# Patient Record
Sex: Male | Born: 1940 | Race: White | Hispanic: No | Smoking: Never smoker
Health system: Southern US, Community
[De-identification: ages and names within clinical notes are randomized; demographics above are authoritative.]

## PROBLEM LIST (undated history)

## (undated) DIAGNOSIS — E785 Hyperlipidemia, unspecified: Secondary | ICD-10-CM

## (undated) DIAGNOSIS — M21611 Bunion of right foot: Secondary | ICD-10-CM

## (undated) DIAGNOSIS — F329 Major depressive disorder, single episode, unspecified: Secondary | ICD-10-CM

## (undated) DIAGNOSIS — F32A Depression, unspecified: Secondary | ICD-10-CM

## (undated) DIAGNOSIS — I4891 Unspecified atrial fibrillation: Secondary | ICD-10-CM

## (undated) DIAGNOSIS — M1990 Unspecified osteoarthritis, unspecified site: Secondary | ICD-10-CM

## (undated) DIAGNOSIS — I10 Essential (primary) hypertension: Secondary | ICD-10-CM

## (undated) HISTORY — DX: Major depressive disorder, single episode, unspecified: F32.9

## (undated) HISTORY — DX: Unspecified atrial fibrillation: I48.91

## (undated) HISTORY — DX: Bunion of right foot: M21.611

## (undated) HISTORY — DX: Depression, unspecified: F32.A

## (undated) HISTORY — DX: Unspecified osteoarthritis, unspecified site: M19.90

## (undated) HISTORY — DX: Hyperlipidemia, unspecified: E78.5

---

## 2005-08-12 ENCOUNTER — Ambulatory Visit: Payer: Self-pay | Admitting: Gastroenterology

## 2009-03-03 ENCOUNTER — Ambulatory Visit: Payer: Self-pay | Admitting: Unknown Physician Specialty

## 2009-05-13 ENCOUNTER — Encounter: Admission: RE | Admit: 2009-05-13 | Discharge: 2009-05-13 | Payer: Self-pay

## 2010-06-29 ENCOUNTER — Ambulatory Visit: Payer: Self-pay | Admitting: Gastroenterology

## 2010-07-01 LAB — PATHOLOGY REPORT

## 2013-06-04 ENCOUNTER — Ambulatory Visit: Payer: Self-pay | Admitting: Internal Medicine

## 2013-06-04 LAB — CBC CANCER CENTER
Basophil #: 0.1 x10 3/mm (ref 0.0–0.1)
Basophil %: 1.7 %
Eosinophil #: 0.4 x10 3/mm (ref 0.0–0.7)
HCT: 41.8 % (ref 40.0–52.0)
HGB: 14.3 g/dL (ref 13.0–18.0)
Lymphocyte %: 26.9 %
MCH: 31.3 pg (ref 26.0–34.0)
MCHC: 34.3 g/dL (ref 32.0–36.0)
Monocyte #: 0.6 x10 3/mm (ref 0.2–1.0)
Monocyte %: 10.5 %
Neutrophil #: 3.2 x10 3/mm (ref 1.4–6.5)
Platelet: 197 x10 3/mm (ref 150–440)
RBC: 4.57 10*6/uL (ref 4.40–5.90)
WBC: 5.9 x10 3/mm (ref 3.8–10.6)

## 2013-06-04 LAB — PLATELET FUNCTION ASSAY
COL/ADP PLT FXN SCRN: 82 Seconds (ref 0–100)
COL/EPI PLT FXN SCRN: >300 Seconds — ABNORMAL HIGH

## 2013-06-04 LAB — PROTIME-INR
INR: 1
Prothrombin Time: 13.6 secs (ref 11.5–14.7)

## 2013-06-04 LAB — COMPREHENSIVE METABOLIC PANEL
Albumin: 3.9 g/dL (ref 3.4–5.0)
Chloride: 109 mmol/L — ABNORMAL HIGH (ref 98–107)
EGFR (Non-African Amer.): >60
Glucose: 108 mg/dL — ABNORMAL HIGH (ref 65–99)
Osmolality: 292 (ref 275–301)

## 2013-06-04 LAB — APTT: Activated PTT: 28.1 secs (ref 23.6–35.9)

## 2013-06-22 ENCOUNTER — Ambulatory Visit: Payer: Self-pay | Admitting: Internal Medicine

## 2013-06-27 ENCOUNTER — Ambulatory Visit: Payer: Self-pay | Admitting: Unknown Physician Specialty

## 2015-12-09 ENCOUNTER — Ambulatory Visit (INDEPENDENT_AMBULATORY_CARE_PROVIDER_SITE_OTHER): Payer: Medicare Other

## 2015-12-09 ENCOUNTER — Encounter: Payer: Self-pay | Admitting: Podiatry

## 2015-12-09 ENCOUNTER — Ambulatory Visit (INDEPENDENT_AMBULATORY_CARE_PROVIDER_SITE_OTHER): Payer: Medicare Other | Admitting: Podiatry

## 2015-12-09 VITALS — BP 131/89 | HR 89 | Resp 18

## 2015-12-09 DIAGNOSIS — M2011 Hallux valgus (acquired), right foot: Secondary | ICD-10-CM

## 2015-12-09 DIAGNOSIS — Q828 Other specified congenital malformations of skin: Secondary | ICD-10-CM

## 2015-12-09 DIAGNOSIS — M722 Plantar fascial fibromatosis: Secondary | ICD-10-CM

## 2015-12-09 NOTE — Progress Notes (Signed)
   Subjective:    Patient ID: Charles SearsGlenn D Corrales, male    DOB: 03/03/1941, 75 y.o.   MRN: [id number]  HPI: He states that he has painful calluses to the plantar aspect of the bilateral foot and has had these for years. It has gotten to the point where he can no longer walk without severe discomfort. He wants to know if there is anything that can be done. He's tried other doctors in the past to no avail.    Review of Systems  Constitutional: Positive for unexpected weight change.  HENT: Positive for sinus pressure.   Eyes: Positive for redness.  Endocrine:       Excessive thirst  Neurological: Positive for dizziness.  All other systems reviewed and are negative.      Objective:   Physical Exam: Vital signs are stable he is alert and oriented 3. Pulses are palpable. Neurologic sensorium is intact per Semmes-Weinstein monofilament. Deep tendon reflexes are intact. Muscle strength +5 over 5 dorsiflexion plantar flexors and inverters everters all digits of musculature is intact. Orthopedic evaluation and stress all joints distal to the ankle hopeful range of motion without crepitation. 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 Hammertoe deformities are noted as well. This is resulting in plantarflexion of the lesser metatarsals and the first metatarsal of the right foot. Cutaneous evaluation of Mr. supple well-hydrated cutis morbid diffuse callus beneath the lesser metatarsal phalangeal joints of the left foot however the right foot does demonstrate a very punctate callus beneath the first metatarsophalangeal joint fibular sesamoid area. No open lesions or wounds.        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  Plan: Sharp debridement of the excessive skin today and chemical debridement of the deep tissue under occlusion with salicylic acid to be left on 3 days and  washed off thoroughly I will follow-up with him in 3 months.

## 2015-12-11 ENCOUNTER — Other Ambulatory Visit: Payer: Self-pay | Admitting: Family Medicine

## 2015-12-11 DIAGNOSIS — R634 Abnormal weight loss: Secondary | ICD-10-CM

## 2015-12-15 ENCOUNTER — Ambulatory Visit
Admission: RE | Admit: 2015-12-15 | Discharge: 2015-12-15 | Disposition: A | Discharge status: Home / Self Care | Payer: Medicare Other | Source: Ambulatory Visit | Attending: Family Medicine | Admitting: Family Medicine

## 2015-12-15 ENCOUNTER — Other Ambulatory Visit: Payer: Self-pay | Admitting: Family Medicine

## 2015-12-15 DIAGNOSIS — R634 Abnormal weight loss: Secondary | ICD-10-CM | POA: Diagnosis present

## 2015-12-15 DIAGNOSIS — I700 Atherosclerosis of aorta: Secondary | ICD-10-CM | POA: Insufficient documentation

## 2015-12-15 DIAGNOSIS — I2510 Atherosclerotic heart disease of native coronary artery without angina pectoris: Secondary | ICD-10-CM | POA: Diagnosis not present

## 2015-12-15 DIAGNOSIS — K5730 Diverticulosis of large intestine without perforation or abscess without bleeding: Secondary | ICD-10-CM | POA: Diagnosis not present

## 2015-12-15 HISTORY — DX: Essential (primary) hypertension: I10

## 2015-12-15 MED ORDER — IOPAMIDOL (ISOVUE-300) INJECTION 61%
100.0000 mL | Freq: Once | INTRAVENOUS | Status: AC | PRN
  Administered 2015-12-15: 100 mL via INTRAVENOUS

## 2016-03-09 ENCOUNTER — Ambulatory Visit: Payer: Medicare Other | Admitting: Podiatry

## 2016-12-27 ENCOUNTER — Encounter: Payer: Medicare Other | Attending: Internal Medicine | Admitting: Dietician

## 2016-12-27 ENCOUNTER — Encounter: Payer: Self-pay | Admitting: Dietician

## 2016-12-27 VITALS — BP 108/64 | Ht 72.0 in | Wt 195.5 lb

## 2016-12-27 DIAGNOSIS — E118 Type 2 diabetes mellitus with unspecified complications: Secondary | ICD-10-CM | POA: Diagnosis present

## 2016-12-27 DIAGNOSIS — Z6826 Body mass index (BMI) 26.0-26.9, adult: Secondary | ICD-10-CM | POA: Insufficient documentation

## 2016-12-27 DIAGNOSIS — Z713 Dietary counseling and surveillance: Secondary | ICD-10-CM | POA: Diagnosis not present

## 2016-12-27 DIAGNOSIS — E119 Type 2 diabetes mellitus without complications: Secondary | ICD-10-CM

## 2016-12-27 NOTE — Progress Notes (Signed)
Diabetes Self-Management Education  Visit Type: First/Initial  Appt. Start Time: 1030 Appt. End Time: 1130  12/27/2016  [name], identified by name and date of birth, is a 76 y.o. male with a diagnosis of Diabetes: Type 2.   ASSESSMENT  Blood pressure 108/64, height 6' (1.829 m), weight 195 lb 8 oz (88.7 kg). Body mass index is 26.51 kg/m.      Diabetes Self-Management Education - 12/27/16 1214      Visit Information   Visit Type First/Initial     Initial Visit   Diabetes Type Type 2     Health Coping   How would you rate your overall health? Good     Psychosocial Assessment   Patient Belief/Attitude about Diabetes Motivated to manage diabetes   Self-care barriers None   Self-management support Doctor's office;Family   Other persons present Patient   Patient Concerns Glycemic Control;Weight Control  prevent complications   Special Needs None   Preferred Learning Style Auditory;Hands on;Visual   Learning Readiness Ready   What is the last grade level you completed in school? 12 + some college     Pre-Education Assessment   Patient understands the diabetes disease and treatment process. Needs Review   Patient understands incorporating nutritional management into lifestyle. Needs Review   Patient undertands incorporating physical activity into lifestyle. Needs Review   Patient understands using medications safely. Needs Review   Patient understands monitoring blood glucose, interpreting and using results Needs Review   Patient understands prevention, detection, and treatment of acute complications. Needs Review   Patient understands prevention, detection, and treatment of chronic complications. Needs Review   Patient understands how to develop strategies to address psychosocial issues. Needs Instruction   Patient understands how to develop strategies to promote health/change behavior. Needs Review     Complications   Last HgB A1C per patient/outside source 5.6  %  12-16-16   How often do you check your blood sugar? 1-2 times/day   Fasting Blood glucose range (mg/dL) 40-981;[id number];130-179   Have you had a dilated eye exam in the past 12 months? No  2 years ago   Have you had a dental exam in the past 12 months? Yes  [id number]; appt 07-2017   Are you checking your feet? Yes-c/o constant pain right foot due to bunion   How many days per week are you checking your feet? 7     Dietary Intake   Breakfast --  eats breakfast at 9a=usually eats oatmeal and fruit   Snack (morning) --  none   Lunch --  eats lunch 12p-1p   Snack (afternoon) --  eats fruit and almonds at 3p   Dinner --  eats supper at 6p   Snack (evening) --  eats snack 8p-9p=almonds, animal crackers, celery, fruit, or carrots    Beverage(s) water 2-3 glasses/day, unsweetened coffee 3 cups/day, orange juice 1x/day and milk 1x/day     Exercise   Exercise Type Light (walking / raking leaves)   How many days per week to you exercise? 6   How many minutes per day do you exercise? 45   Total minutes per week of exercise 270     Patient Education   Previous Diabetes Education No   Disease state  Definition of diabetes, type 1 and 2, and the diagnosis of diabetes;Explored patient's options for treatment of their diabetes   Nutrition management  Role of diet in the treatment of diabetes and the relationship between the three main macronutrients  and blood glucose level;Food label reading, portion sizes and measuring food.;Carbohydrate counting   Physical activity and exercise  Helped patient identify appropriate exercises in relation to his/her diabetes, diabetes complications and other health issue.;Role of exercise on diabetes management, blood pressure control and cardiac health.   Medications Reviewed patients medication for diabetes, action, purpose, timing of dose and side effects.   Monitoring Purpose and frequency of SMBG.;Identified appropriate SMBG and/or A1C goals.;Yearly dilated eye  exam;Taught/discussed recording of test results and interpretation of SMBG.   Chronic complications Relationship between chronic complications and blood glucose control;Dental care;Retinopathy and reason for yearly dilated eye exams;Nephropathy, what it is, prevention of, the use of ACE, ARB's and early detection of through urine microalbumia.;Lipid levels, blood glucose control and heart disease;Assessed and discussed foot care and prevention of foot problems;Reviewed with patient heart disease, higher risk of, and prevention   Personal strategies to promote health Lifestyle issues that need to be addressed for better diabetes care;Helped patient develop diabetes management plan for (enter comment)     Outcomes   Expected Outcomes Demonstrated interest in learning. Expect positive outcomes      Individualized Plan for Diabetes Self-Management Training:   Learning Objective:  Patient will have a greater understanding of diabetes self-management. Patient education plan is to attend individual and/or group sessions per assessed needs and concerns.   Plan:   Patient Instructions   Check blood sugars 2 x day before breakfast OR 2 hrs after supper every  day and record   Bring blood sugar records to the next appointment/class  Exercise: walk  for    30-45  minutes   6 days a week  Eat 3 meals day,   1  snack a day at bedtime  Space meals 4-5 hours apart  Avoid sugar sweetened drinks (soda, tea, coffee, sports drinks, juices)  Limit intake of sweets/desserts and fried foods   Eat 3 carbohydrate servings/meal + protein  Eat 1 carbohydrate serving/snack + protein  Make dentist / eye doctor appointments  Get a Sharps container  Return for appointment/classes on: call if decides to schedule classes   Expected Outcomes:  Demonstrated interest in learning. Expect positive outcomes  Education material provided: General meal planning guidelines  If problems or questions, patient to  contact team via:  [phone]  Future DSME appointment:  TBD

## 2016-12-27 NOTE — Patient Instructions (Signed)
  Check blood sugars 2 x day before breakfast OR 2 hrs after supper every  day and record   Bring blood sugar records to the next appointment/class  Exercise: walk  for    30-45  minutes   6 days a week  Eat 3 meals day,   1  snack a day at bedtime  Space meals 4-5 hours apart  Avoid sugar sweetened drinks (soda, tea, coffee, sports drinks, juices)  Limit intake of sweets/desserts and fried foods   Eat 3 carbohydrate servings/meal + protein  Eat 1 carbohydrate serving/snack + protein  Make dentist / eye doctor appointments  Get a Sharps container  Return for appointment/classes on: call if decides to schedule classes

## 2017-01-03 ENCOUNTER — Telehealth: Payer: Self-pay | Admitting: Dietician

## 2017-01-03 NOTE — Telephone Encounter (Signed)
Have not heard from pt-called pt and gave him dates of upcoming classes but he wants to wait to schedule classes until after he checks on his other dr appointments this month including a stress test for a heart murmur-pt will call us back if he decides to schedule diabetes classes

## 2017-01-09 ENCOUNTER — Encounter: Payer: Self-pay | Admitting: Dietician

## 2018-01-26 ENCOUNTER — Ambulatory Visit (INDEPENDENT_AMBULATORY_CARE_PROVIDER_SITE_OTHER): Payer: Medicare Other | Admitting: Urology

## 2018-01-26 ENCOUNTER — Encounter: Payer: Self-pay | Admitting: Urology

## 2018-01-26 VITALS — BP 128/80 | HR 97 | Ht 66.0 in | Wt 194.0 lb

## 2018-01-26 DIAGNOSIS — R972 Elevated prostate specific antigen [PSA]: Secondary | ICD-10-CM | POA: Diagnosis not present

## 2018-01-26 LAB — URINALYSIS, COMPLETE
Bilirubin, UA: NEGATIVE
Glucose, UA: NEGATIVE
Ketones, UA: NEGATIVE
LEUKOCYTES UA: NEGATIVE
Nitrite, UA: NEGATIVE
PH UA: 6.5 (ref 5.0–7.5)
PROTEIN UA: NEGATIVE
Specific Gravity, UA: 1.015 (ref 1.005–1.030)
Urobilinogen, Ur: 0.2 mg/dL (ref 0.2–1.0)

## 2018-01-26 LAB — MICROSCOPIC EXAMINATION
Bacteria, UA: NONE SEEN
Epithelial Cells (non renal): NONE SEEN /hpf (ref 0–10)

## 2018-01-30 ENCOUNTER — Encounter: Payer: Self-pay | Admitting: Urology

## 2018-01-30 NOTE — Progress Notes (Signed)
01/26/2018 7:15 PM   Charles Mcclure November 09, 1940 [id number]  Referring provider: Marguarite Arbour, MD [address] [facility] [place], [place] [address]  Chief Complaint  Patient presents with  . Elevated PSA    HPI: Charles Mcclure is a 77 year old male referred for evaluation of an elevated PSA.  A PSA performed in April 2019 was 5.58.  Previous PSA results are as follows:  05/2014    2.35 05/2015    2.9 11/2016      3.52 11/2017      5.58  He denies bothersome lower urinary tract symptoms.  He denies dysuria or gross hematuria.  He has no flank, abdominal, pelvic or scrotal pain.   PMH: Past Medical History:  Diagnosis Date  . Arthritis   . Atrial fibrillation (HCC)   . Bunion, right foot   . Depression   . Hyperlipidemia   . Hypertension     Surgical History: History reviewed. No pertinent surgical history.  Home Medications:  Allergies as of 01/26/2018      Reactions   Meperidine Other (See Comments)   restless      Medication List        Accurate as of 01/26/18 11:59 PM. Always use your most recent med list.          apixaban 5 MG Tabs tablet Commonly known as:  ELIQUIS Take 1 tablet by mouth 2 (two) times daily.   atorvastatin 10 MG tablet Commonly known as:  LIPITOR Take 1 tablet by mouth daily.   CALCIUM PO Take 1 tablet by mouth daily.   fenofibrate 160 MG tablet Take 1 tablet by mouth daily.   lisinopril-hydrochlorothiazide 10-12.5 MG tablet Commonly known as:  PRINZIDE,ZESTORETIC Take 1 tablet by mouth daily.   MAGNESIUM PO Take 1 tablet by mouth daily.   metFORMIN 500 MG tablet Commonly known as:  GLUCOPHAGE Take 1 tablet by mouth daily.   metFORMIN 500 MG tablet Commonly known as:  GLUCOPHAGE TAKE 1 TABLET BY MOUTH  DAILY WITH BREAKFAST   niacin 500 MG CR tablet Commonly known as:  NIASPAN Take 1 tablet by mouth daily. Pt taking 1/2 tablet BID   sertraline 50 MG tablet Commonly known as:  ZOLOFT Take 1  tablet by mouth daily.   sildenafil 20 MG tablet Commonly known as:  REVATIO Take 1 tablet by mouth daily as needed.   TGT PSYLLIUM FIBER 0.52 g capsule Generic drug:  psyllium Take 1 capsule by mouth daily.       Allergies:  Allergies  Allergen Reactions  . Meperidine Other (See Comments)    restless    Family History: No family history on file.  Social History:  reports that he has never smoked. He has never used smokeless tobacco. He reports that he drinks about 0.6 - 1.2 oz of alcohol per week. He reports that he does not use drugs.  ROS: UROLOGY Frequent Urination?: Yes Hard to postpone urination?: No Burning/pain with urination?: No Get up at night to urinate?: Yes Leakage of urine?: No Urine stream starts and stops?: No Trouble starting stream?: No Do you have to strain to urinate?: No Blood in urine?: No Urinary tract infection?: No Sexually transmitted disease?: No Injury to kidneys or bladder?: No Painful intercourse?: No Weak stream?: No Penile pain?: No  Gastrointestinal Nausea?: No Vomiting?: No Indigestion/heartburn?: No Diarrhea?: No Constipation?: No  Constitutional Fever: No Night sweats?: No Weight loss?: No Fatigue?: No  Skin Itching?: No  Eyes  Blurred vision?: No Double vision?: Yes  Ears/Nose/Throat Sore throat?: No Sinus problems?: No  Hematologic/Lymphatic Swollen glands?: No Easy bruising?: Yes  Cardiovascular Leg swelling?: No Chest pain?: No  Respiratory Cough?: Yes Shortness of breath?: No  Endocrine Excessive thirst?: No  Musculoskeletal Back pain?: No Joint pain?: No  Neurological Headaches?: No Dizziness?: No  Psychologic Depression?: No Anxiety?: No  Physical Exam: BP 128/80   Pulse 97   Ht 5\' 6"  (1.676 m)   Wt 194 lb (88 kg)   BMI 31.31 kg/m   Constitutional:  Alert and oriented, No acute distress. HEENT: [place] AT, moist mucus membranes.  Trachea midline, no masses. Cardiovascular: No  clubbing, cyanosis, or edema. Respiratory: Normal respiratory effort, no increased work of breathing. GI: Abdomen is soft, nontender, nondistended, no abdominal masses GU: No CVA tenderness.  Prostate 40 g, smooth without nodules Lymph: No cervical or inguinal lymphadenopathy. Skin: No rashes, bruises or suspicious lesions. Neurologic: Grossly intact, no focal deficits, moving all 4 extremities. Psychiatric: Normal mood and affect.  Laboratory Data:  Urinalysis Dipstick/microscopy negative    Assessment & Plan:   Although PSA is a prostate cancer screening test he was informed that cancer is not the most common cause of an elevated PSA. Other potential causes including BPH and inflammation were discussed. He was informed that the only way to adequately diagnose prostate cancer would be a transrectal ultrasound and biopsy of the prostate. The procedure was discussed including potential risks of bleeding and infection/sepsis. He was also informed that a negative biopsy does not conclusively rule out the possibility that prostate cancer may be present and that continued monitoring is required. The use of newer adjunctive blood tests including PHI and 4kScore were discussed. The use of multiparametric prostate MRI was also discussed however is not typically used for initial evaluation of an elevated PSA. Continued periodic surveillance was also discussed.  We did discuss current prostate cancer screening recommendations by the American U[employer] Association and that screening is not recommended after age 77 and up to age 77 in healthy individuals.  Will repeat his PSA however he wanted to hold off on repeating for 6 months.  If his PSA returns to baseline would recommend discontinuing screening PSA.    Riki AltesScott C Breck [place], MD  [facility] [address], [address] [place], [place] [address] [phone]

## 2018-02-04 ENCOUNTER — Encounter: Payer: Self-pay | Admitting: Urology

## 2018-02-04 DIAGNOSIS — R972 Elevated prostate specific antigen [PSA]: Secondary | ICD-10-CM | POA: Insufficient documentation

## 2018-11-21 ENCOUNTER — Other Ambulatory Visit: Payer: Self-pay | Admitting: Internal Medicine

## 2018-11-21 ENCOUNTER — Ambulatory Visit
Admission: RE | Admit: 2018-11-21 | Discharge: 2018-11-21 | Disposition: A | Discharge status: Home / Self Care | Payer: Medicare Other | Source: Ambulatory Visit | Attending: Internal Medicine | Admitting: Internal Medicine

## 2018-11-21 ENCOUNTER — Other Ambulatory Visit: Payer: Self-pay

## 2018-11-21 DIAGNOSIS — R270 Ataxia, unspecified: Secondary | ICD-10-CM

## 2018-11-21 DIAGNOSIS — S0990XA Unspecified injury of head, initial encounter: Secondary | ICD-10-CM | POA: Diagnosis present

## 2018-12-12 ENCOUNTER — Other Ambulatory Visit: Payer: Self-pay | Admitting: Neurology

## 2018-12-12 DIAGNOSIS — I725 Aneurysm of other precerebral arteries: Secondary | ICD-10-CM

## 2019-01-22 ENCOUNTER — Ambulatory Visit: Payer: Medicare Other

## 2019-03-17 ENCOUNTER — Encounter: Payer: Self-pay | Admitting: Medical Oncology

## 2019-03-17 ENCOUNTER — Emergency Department
Admission: EM | Admit: 2019-03-17 | Discharge: 2019-03-17 | Disposition: A | Discharge status: Home / Self Care | Payer: Medicare Other | Attending: Emergency Medicine | Admitting: Emergency Medicine

## 2019-03-17 ENCOUNTER — Emergency Department: Payer: Medicare Other

## 2019-03-17 DIAGNOSIS — F0390 Unspecified dementia without behavioral disturbance: Secondary | ICD-10-CM | POA: Diagnosis not present

## 2019-03-17 DIAGNOSIS — S0001XA Abrasion of scalp, initial encounter: Secondary | ICD-10-CM | POA: Diagnosis not present

## 2019-03-17 DIAGNOSIS — S0990XA Unspecified injury of head, initial encounter: Secondary | ICD-10-CM

## 2019-03-17 DIAGNOSIS — S0081XA Abrasion of other part of head, initial encounter: Secondary | ICD-10-CM | POA: Diagnosis not present

## 2019-03-17 DIAGNOSIS — Y9389 Activity, other specified: Secondary | ICD-10-CM | POA: Insufficient documentation

## 2019-03-17 DIAGNOSIS — I10 Essential (primary) hypertension: Secondary | ICD-10-CM | POA: Insufficient documentation

## 2019-03-17 DIAGNOSIS — W19XXXA Unspecified fall, initial encounter: Secondary | ICD-10-CM

## 2019-03-17 DIAGNOSIS — Y999 Unspecified external cause status: Secondary | ICD-10-CM | POA: Insufficient documentation

## 2019-03-17 DIAGNOSIS — W010XXA Fall on same level from slipping, tripping and stumbling without subsequent striking against object, initial encounter: Secondary | ICD-10-CM | POA: Insufficient documentation

## 2019-03-17 DIAGNOSIS — Y929 Unspecified place or not applicable: Secondary | ICD-10-CM | POA: Diagnosis not present

## 2019-03-17 NOTE — ED Triage Notes (Signed)
Pt from home via ems- states that he was trying to pick something up when he tripped over a rug and fell. Pt denies LOC. A/O x 4. Pt has some bruising to left side of face and eye. Some bruising noted to BILT knees. Pt ambulatory from stretcher to bed.

## 2019-03-17 NOTE — ED Provider Notes (Addendum)
[facility] Emergency Department Provider Note       Time seen: ----------------------------------------- 4:08 PM on 03/17/2019 -----------------------------------------  I have reviewed the triage vital signs and the nursing notes.  HISTORY   Chief Complaint Fall and Head Injury   HPI Charles Mcclure is a 78 y.o. male with a history of arthritis, atrial fibrillation, depression, hyperlipidemia, hypertension who presents to the ED for a mechanical fall.  Patient states he tripped on a rug and family reports he has early dementia.  He denies any complaints, was noted to have some cuts on his face and head.  He is currently takes a blood thinner.  Past Medical History:  Diagnosis Date  . Arthritis   . Atrial fibrillation (HCC)   . Bunion, right foot   . Depression   . Hyperlipidemia   . Hypertension     Patient Active Problem List   Diagnosis Date Noted  . Elevated PSA 02/04/2018    No past surgical history on file.  Allergies Meperidine  Social History Social History   Tobacco Use  . Smoking status: Never Smoker  . Smokeless tobacco: Never Used  Substance Use Topics  . Alcohol use: Yes    Alcohol/week: 1.0 - 2.0 standard drinks    Types: 1 - 2 Cans of beer per week  . Drug use: No   Review of Systems Constitutional: Negative for fever. Cardiovascular: Negative for chest pain. Respiratory: Negative for shortness of breath. Gastrointestinal: Negative for abdominal pain, vomiting and diarrhea. Musculoskeletal: Negative for back pain. Skin: Positive for abrasions and lacerations Neurological: Negative for headaches, focal weakness or numbness.  All systems negative/normal/unremarkable except as stated in the HPI  ____________________________________________   PHYSICAL EXAM:  VITAL SIGNS: ED Triage Vitals  Enc Vitals Group     BP      Pulse      Resp      Temp      Temp src      SpO2      Weight      Height      Head  Circumference      Peak Flow      Pain Score      Pain Loc      Pain Edu?      Excl. in GC?    Constitutional: Alert, Well appearing and in no distress. Eyes: Small subconjunctival hemorrhage on the left eye, normal extraocular movements. ENT      Head: Normocephalic, scattered abrasions and superficial lacerations on the face and frontal scalp      Nose: No congestion/rhinnorhea.      Mouth/Throat: Mucous membranes are moist.      Neck: No stridor. Cardiovascular: Normal rate, regular rhythm. No murmurs, rubs, or gallops. Respiratory: Normal respiratory effort without tachypnea nor retractions. Breath sounds are clear and equal bilaterally. No wheezes/rales/rhonchi. Gastrointestinal: Soft and nontender. Normal bowel sounds Musculoskeletal: Nontender with normal range of motion in extremities. No lower extremity tenderness nor edema. Neurologic:  Normal speech and language. No gross focal neurologic deficits are appreciated.  Skin: Scattered abrasions and lacerations as noted above Psychiatric: Mood and affect are normal. Speech and behavior are normal.  ____________________________________________  EKG: Interpreted by me.  Atrial fibrillation with a rate of 97 bpm, normal QRS, normal axis, PVC  ____________________________________________  ED COURSE:  As part of my medical decision making, I reviewed the following data within the electronic MEDICAL RECORD NUMBER History obtained from family if available, nursing notes,  old chart and ekg, as well as notes from prior ED visits. Patient presented for mechanical fall, we will assess with imaging as indicated at this time.   Procedures  Charles Mcclure was evaluated in Emergency Department on 03/17/2019 for the symptoms described in the history of present illness. He was evaluated in the context of the global COVID-19 pandemic, which necessitated consideration that the patient might be at risk for infection with the SARS-CoV-2 virus that  causes COVID-19. Institutional protocols and algorithms that pertain to the evaluation of patients at risk for COVID-19 are in a state of rapid change based on information released by regulatory bodies including the CDC and federal and state organizations. These policies and algorithms were followed during the patient's care in the ED.  ____________________________________________   RADIOLOGY Images were viewed by me  CT head, maxillofacial IMPRESSION: 1. No acute intracranial pathology. Small-vessel white matter disease.  2. 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 This continues to exert mass effect on the pons and cerebellum.  3.  No displaced fracture or dislocation of the facial bones. ____________________________________________   DIFFERENTIAL DIAGNOSIS   Fall, contusion, abrasion, laceration, subdural hematoma, subarachnoid  FINAL ASSESSMENT AND PLAN  Fall, minor head injury   Plan: The patient had presented for a mechanical fall. Patient's imaging did not reveal any acute process, he has a known large aneurysm which is unchanged.  No signs of acute injury.  He is cleared for outpatient follow-up.   Laurence Aly, MD    Note: This note was generated in part or whole with voice recognition software. Voice recognition is usually quite accurate but there are transcription errors that can and very often do occur. I apologize for any typographical errors that were not detected and corrected.     Earleen Newport, MD 03/17/19 1649    Earleen Newport, MD 03/17/19 [phone]

## 2019-03-17 NOTE — ED Notes (Signed)
Patient transported to CT 

## 2019-07-23 DEATH — deceased

## 2020-03-10 IMAGING — CT CT HEAD WITHOUT CONTRAST
3 of 6 series · 15 of 47 positions shown, 18 images · non-contrast
Comparison: 11/21/2018

CLINICAL DATA: Trip and fall, head trauma

EXAM:
CT HEAD WITHOUT CONTRAST
CT MAXILLOFACIAL WITHOUT CONTRAST
TECHNIQUE: Multidetector CT imaging of the head and maxillofacial structures
were performed using the standard protocol without intravenous
contrast. Multiplanar CT image reconstructions of the maxillofacial
structures were also generated.

[Series 5: max soft · axial · 0.34mm/px · z∈[-162,-16]mm · 10 of 87 slices shown, 13 images]
[im 9/87  brain]
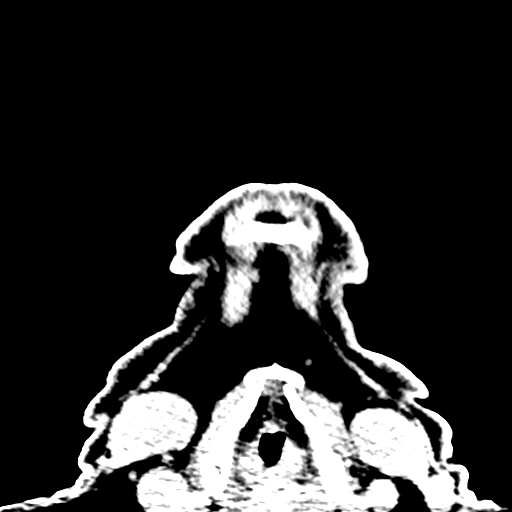
[im 9/87  bone]
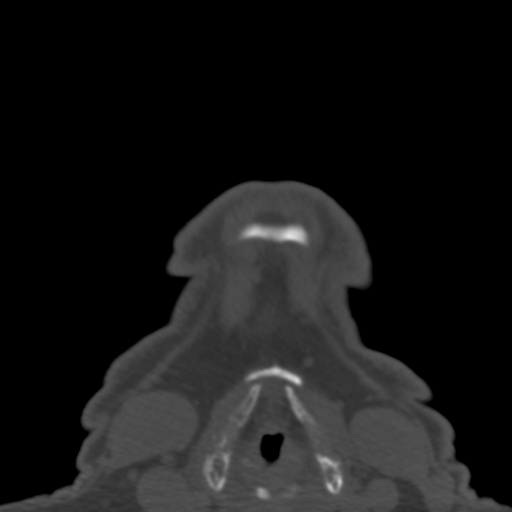
[im 17/87  brain]
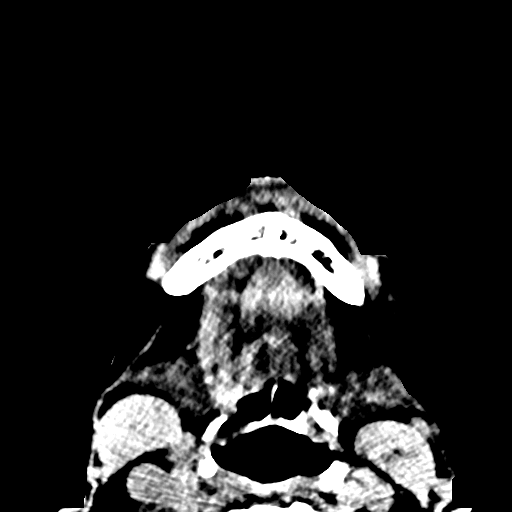
[im 25/87  brain]
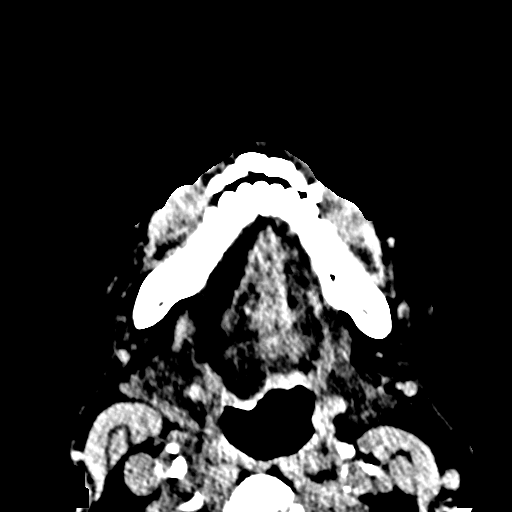
[im 33/87  brain]
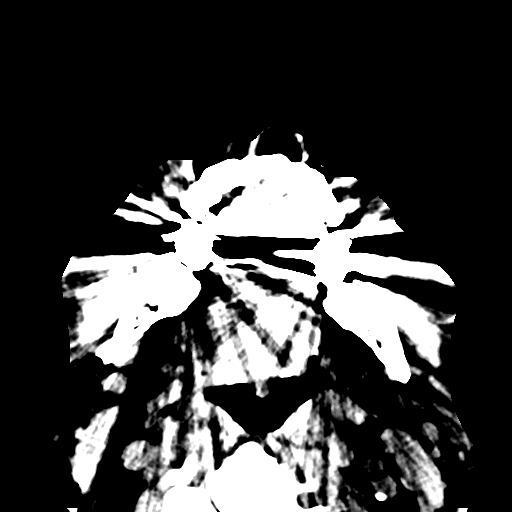
[im 41/87  brain]
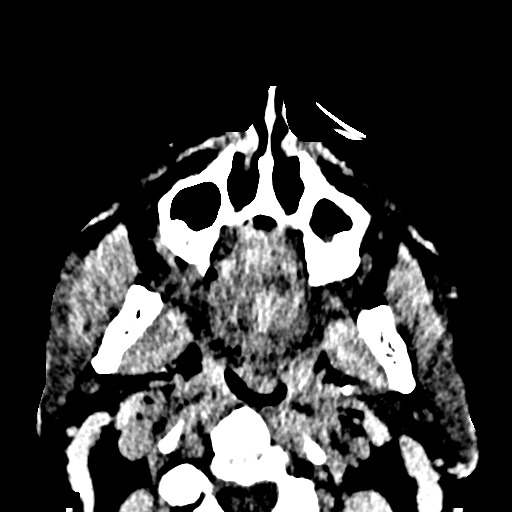
[im 41/87  bone]
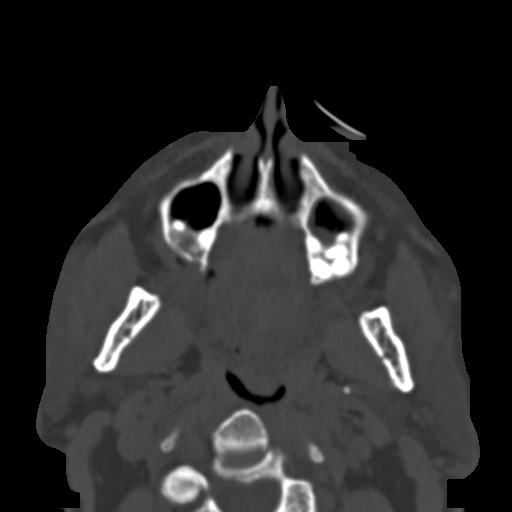
[im 50/87  brain]
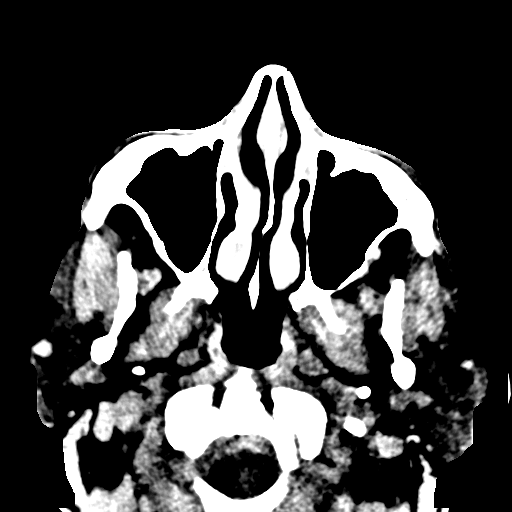
[im 58/87  brain]
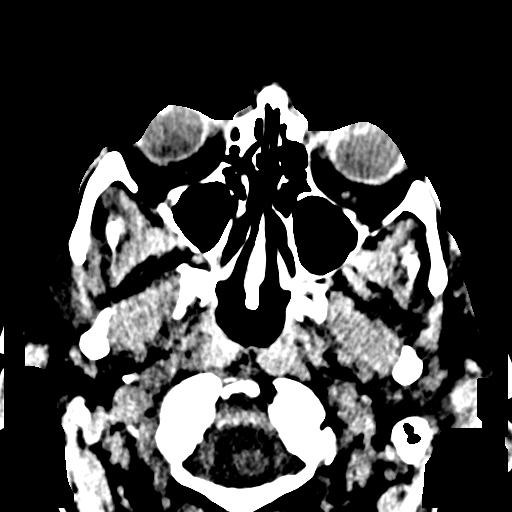
[im 66/87  brain]
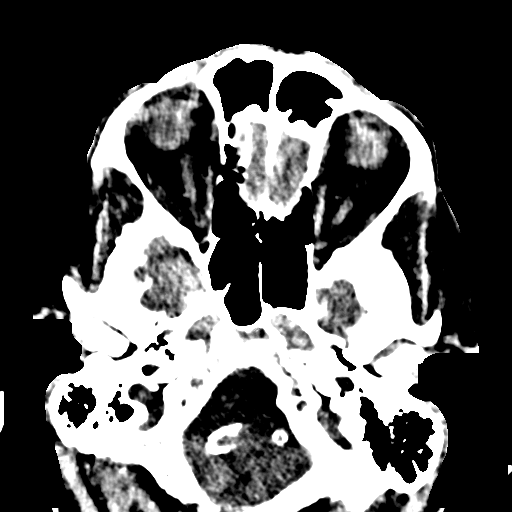
[im 74/87  brain]
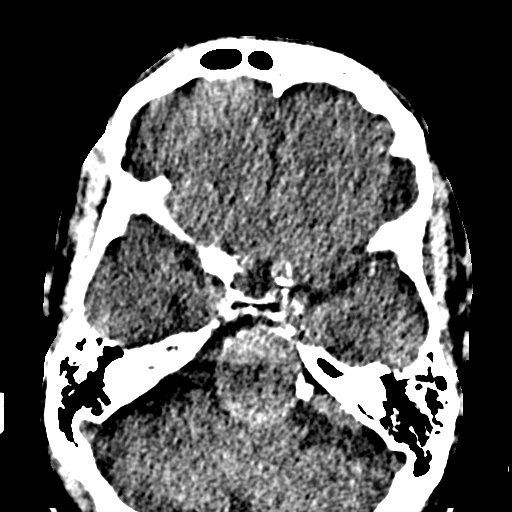
[im 74/87  bone]
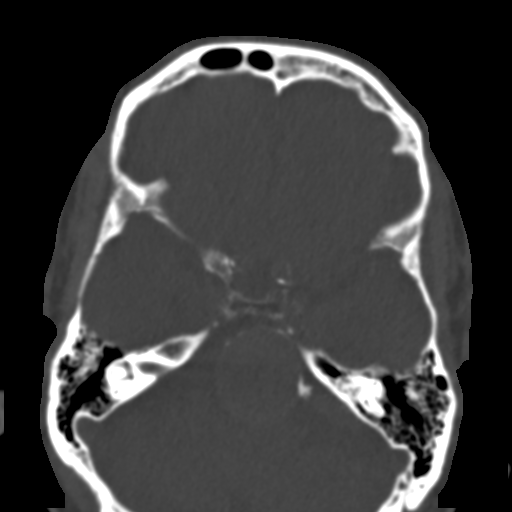
[im 82/87  brain]
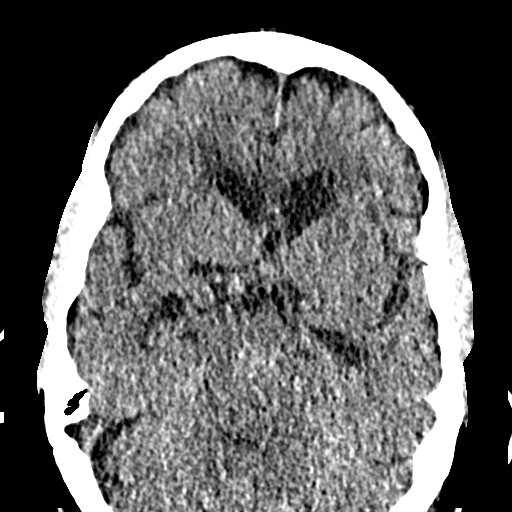

[Series 8: coronal soft tissue · coronal · 0.29mm/px · 3 of 69 slices shown]
[im 14/69  brain]
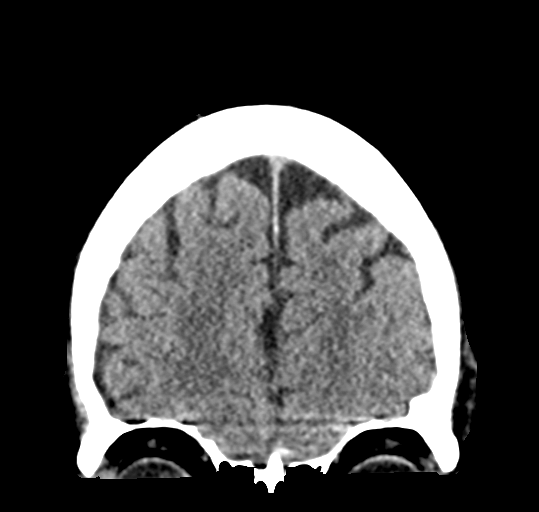
[im 28/69  brain]
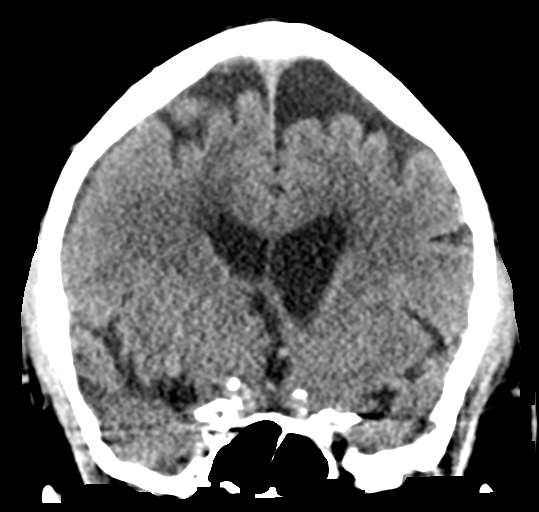
[im 41/69  brain]
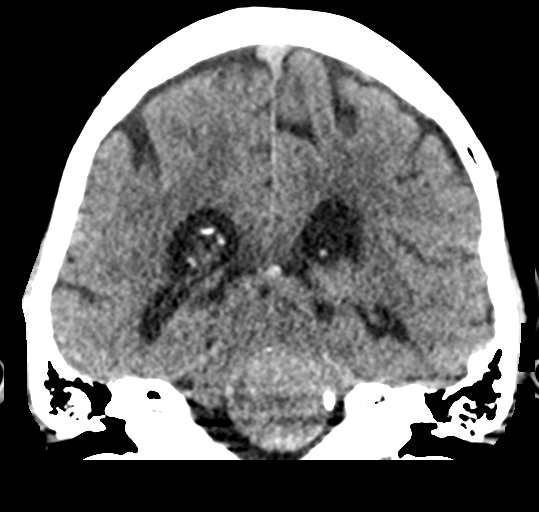

[Series 11: sagittal soft · sagittal · 0.29mm/px · 2 of 88 slices shown]
[im 30/88  brain]
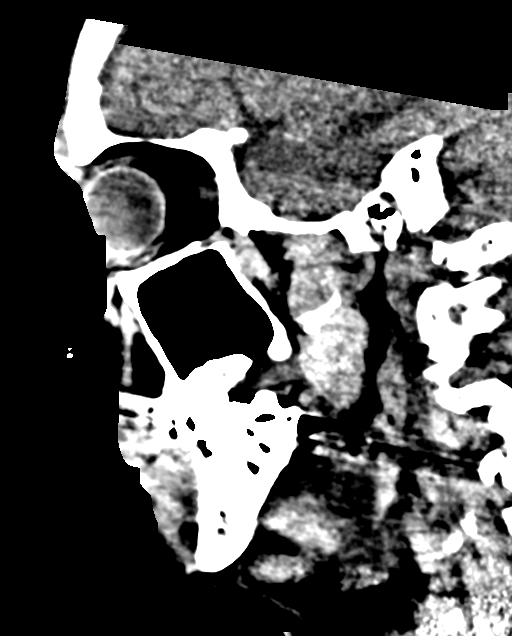
[im 59/88  brain]
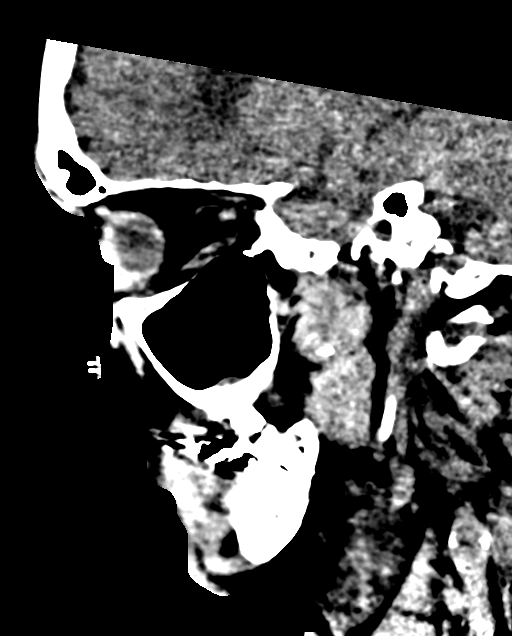

[15 of 47 positions shown; findings below may reference images not displayed]

FINDINGS: CT HEAD FINDINGS

Brain: No evidence of acute infarction, hemorrhage, or
hydrocephalus. Periventricular white matter hypodensity.

Vascular: Redemonstrated large rim calcified fusiform aneurysm which
appears to involve the distal vertebral arteries and basilar artery,
measuring approximately 3.3 x 3.1 x 2.9 cm, not significantly
changed compared to prior examination. This continues to exert mass
effect on the pons and cerebellum.

Skull: Normal. Negative for fracture or focal lesion.

Other: None.

CT MAXILLOFACIAL FINDINGS

Osseous: No fracture or mandibular dislocation. No destructive
process.

Orbits: Negative. No traumatic or inflammatory finding.

Sinuses: Clear.

Soft tissues: Negative.
IMPRESSION: 1. No acute intracranial pathology. Small-vessel white matter
disease.

2. Redemonstrated large rim calcified fusiform aneurysm which
appears to involve the distal vertebral arteries and basilar artery,
measuring approximately 3.3 x 3.1 x 2.9 cm, not significantly
changed compared to prior examination. This continues to exert mass
effect on the pons and cerebellum.

3.  No displaced fracture or dislocation of the facial bones.
